# Patient Record
Sex: Female | Born: 1980 | Race: White | Hispanic: No | Marital: Married | Smoking: Never smoker
Health system: Southern US, Community
[De-identification: ages and names within clinical notes are randomized; demographics above are authoritative.]

---

## 1999-06-08 ENCOUNTER — Other Ambulatory Visit: Admission: RE | Admit: 1999-06-08 | Discharge: 1999-06-08 | Payer: Self-pay | Admitting: Gynecology

## 2002-12-17 ENCOUNTER — Other Ambulatory Visit: Admission: RE | Admit: 2002-12-17 | Discharge: 2002-12-17 | Payer: Self-pay | Admitting: Gynecology

## 2003-12-22 ENCOUNTER — Other Ambulatory Visit: Admission: RE | Admit: 2003-12-22 | Discharge: 2003-12-22 | Payer: Self-pay | Admitting: Internal Medicine

## 2004-10-04 ENCOUNTER — Other Ambulatory Visit: Admission: RE | Admit: 2004-10-04 | Discharge: 2004-10-04 | Payer: Self-pay | Admitting: Gynecology

## 2005-02-01 ENCOUNTER — Encounter: Admission: RE | Admit: 2005-02-01 | Discharge: 2005-02-01 | Payer: Self-pay | Admitting: Gynecology

## 2005-04-27 ENCOUNTER — Inpatient Hospital Stay (HOSPITAL_COMMUNITY): Admission: AD | Admit: 2005-04-27 | Discharge: 2005-04-30 | Payer: Self-pay | Admitting: Gynecology

## 2005-06-09 ENCOUNTER — Other Ambulatory Visit: Admission: RE | Admit: 2005-06-09 | Discharge: 2005-06-09 | Payer: Self-pay | Admitting: Gynecology

## 2006-11-24 ENCOUNTER — Other Ambulatory Visit: Admission: RE | Admit: 2006-11-24 | Discharge: 2006-11-24 | Payer: Self-pay | Admitting: Gynecology

## 2008-03-15 ENCOUNTER — Emergency Department (HOSPITAL_BASED_OUTPATIENT_CLINIC_OR_DEPARTMENT_OTHER): Admission: EM | Admit: 2008-03-15 | Discharge: 2008-03-15 | Payer: Self-pay | Admitting: Emergency Medicine

## 2008-03-16 ENCOUNTER — Ambulatory Visit (HOSPITAL_COMMUNITY): Admission: AD | Admit: 2008-03-16 | Discharge: 2008-03-16 | Payer: Self-pay | Admitting: Obstetrics and Gynecology

## 2008-03-16 ENCOUNTER — Encounter (INDEPENDENT_AMBULATORY_CARE_PROVIDER_SITE_OTHER): Payer: Self-pay | Admitting: Obstetrics and Gynecology

## 2008-10-22 ENCOUNTER — Encounter: Admission: RE | Admit: 2008-10-22 | Discharge: 2008-10-22 | Payer: Self-pay | Admitting: Obstetrics and Gynecology

## 2009-02-10 ENCOUNTER — Inpatient Hospital Stay (HOSPITAL_COMMUNITY): Admission: AD | Admit: 2009-02-10 | Discharge: 2009-02-13 | Payer: Self-pay | Admitting: Obstetrics and Gynecology

## 2009-06-21 IMAGING — US US OB COMP LESS 14 WK
1 series · 14 of 23 positions shown · non-contrast
Comparison: None

CLINICAL DATA: Vaginal bleeding, 76w8d by LMP

OBSTETRIC <14 WK ULTRASOUND
TECHNIQUE: Transabdominal ultrasound was performed for evaluation
of the gestation as well as the maternal uterus and adnexal
regions.

[Series 1: us ob comp less 14 wks · 14 of 23 slices shown]
[im 1/23]
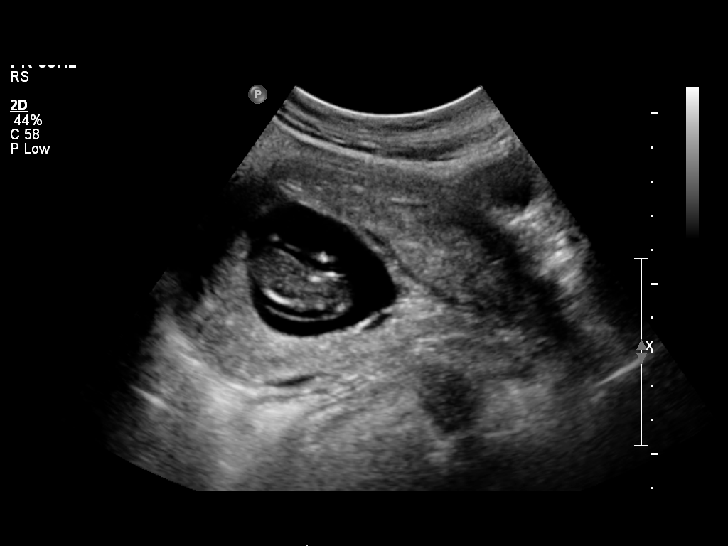
[im 3/23]
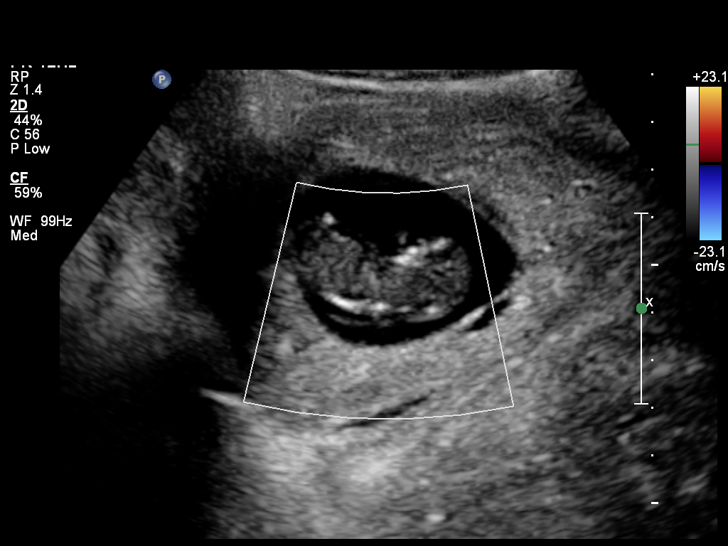
[im 5/23]
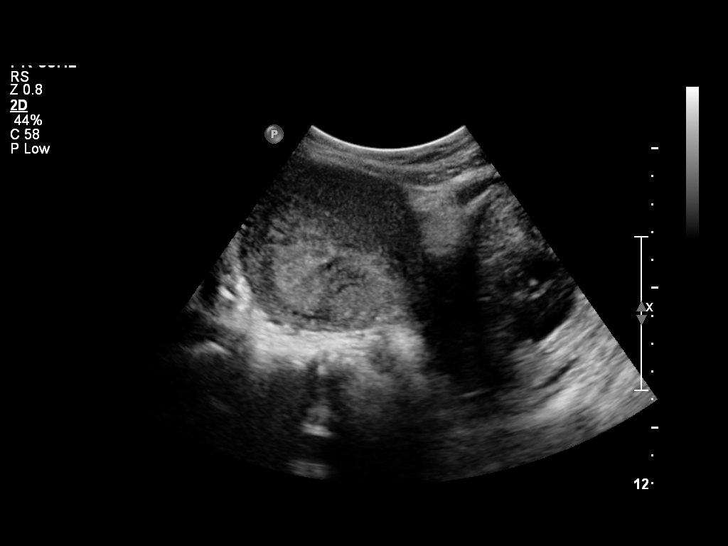
[im 6/23]
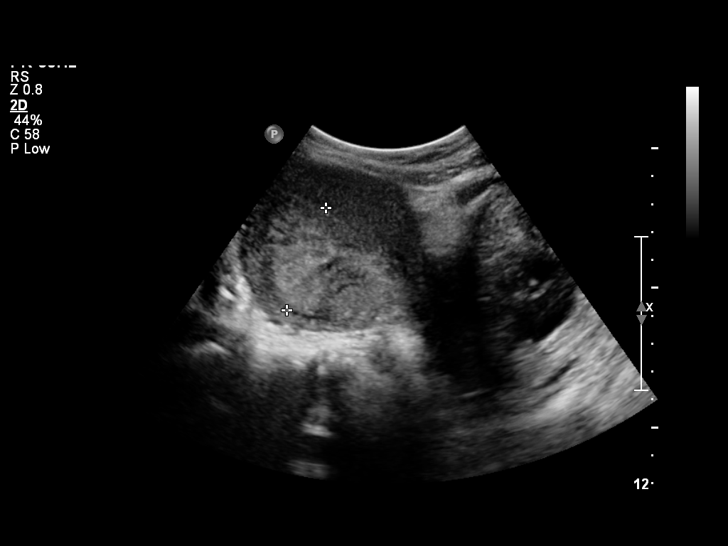
[im 8/23]
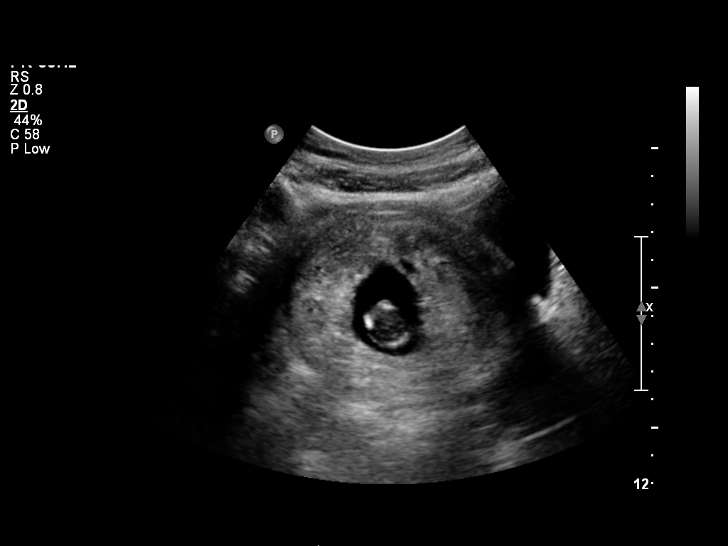
[im 10/23]
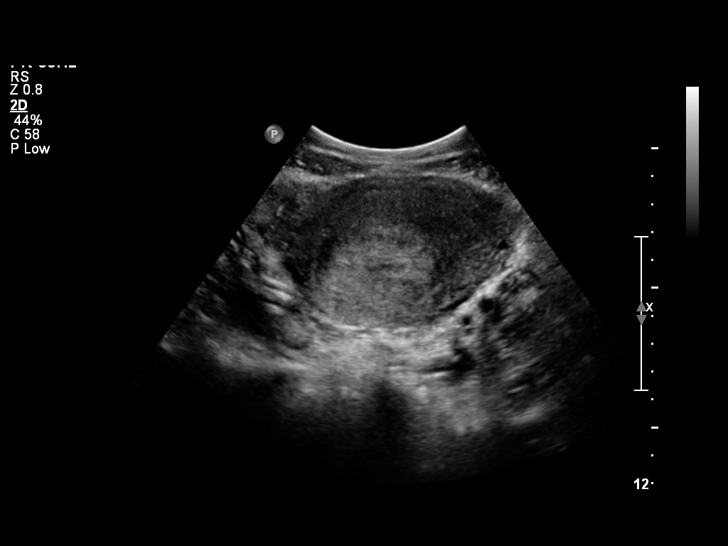
[im 11/23]
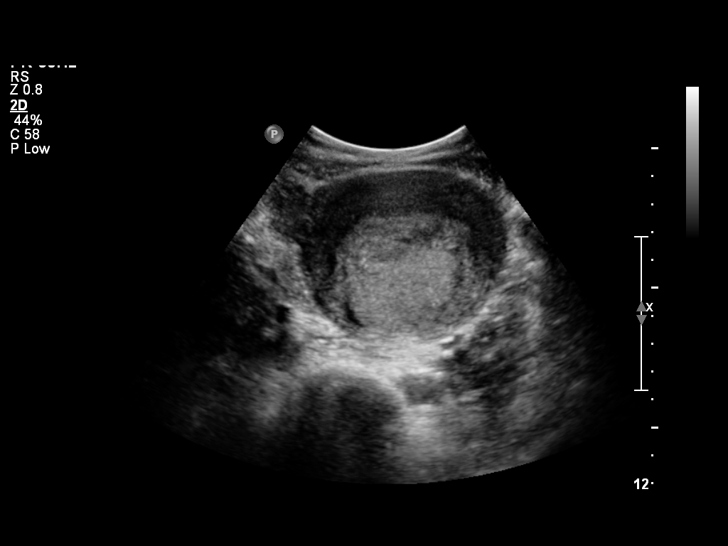
[im 13/23]
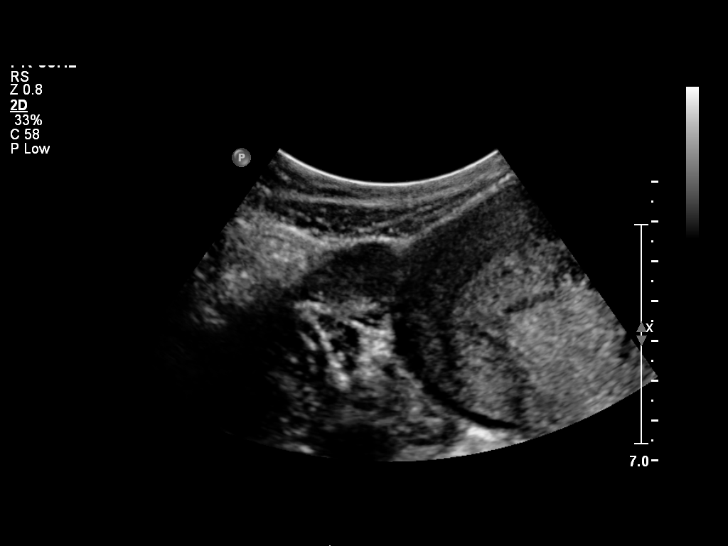
[im 14/23]
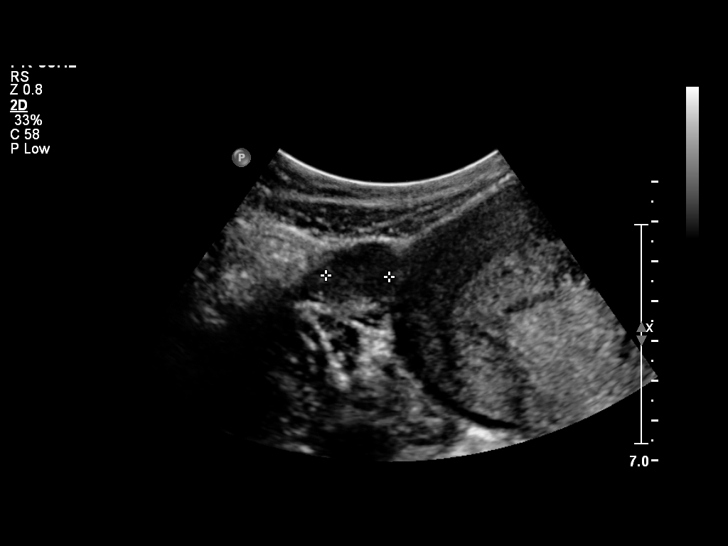
[im 16/23]
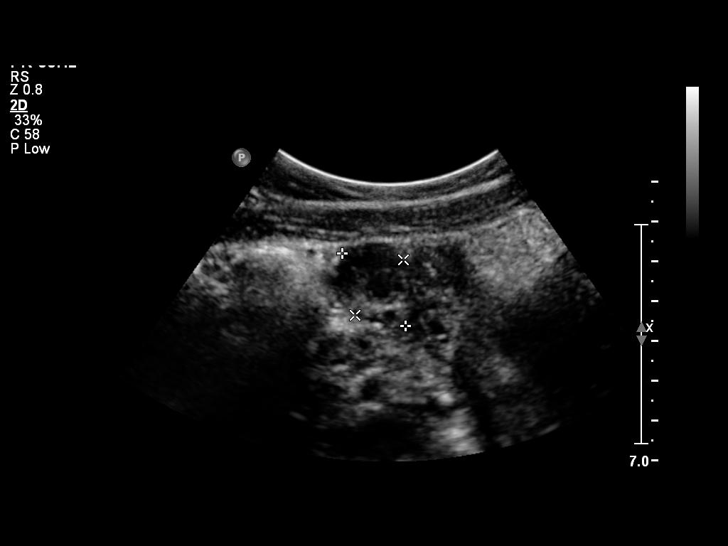
[im 18/23]
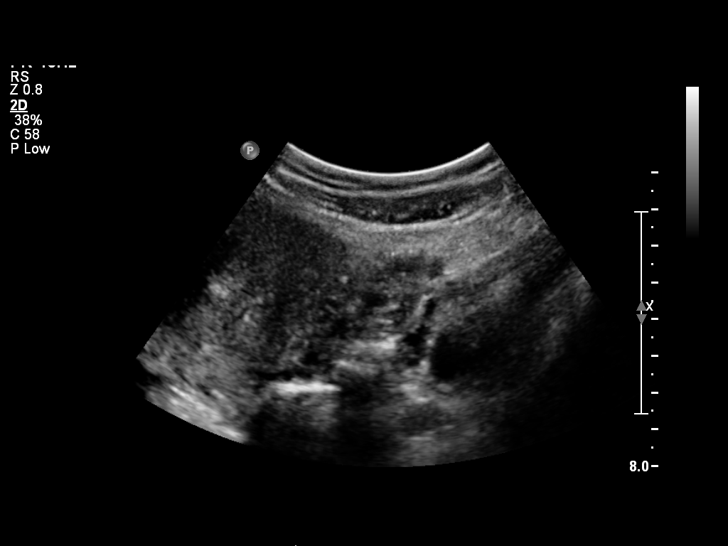
[im 19/23]
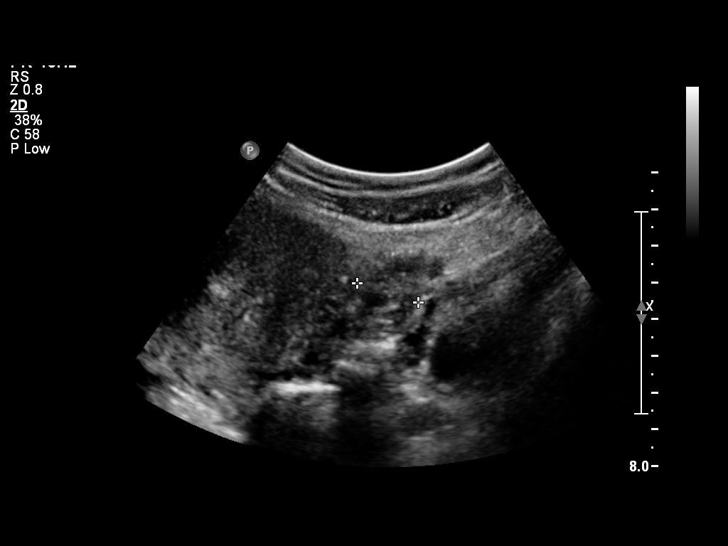
[im 21/23]
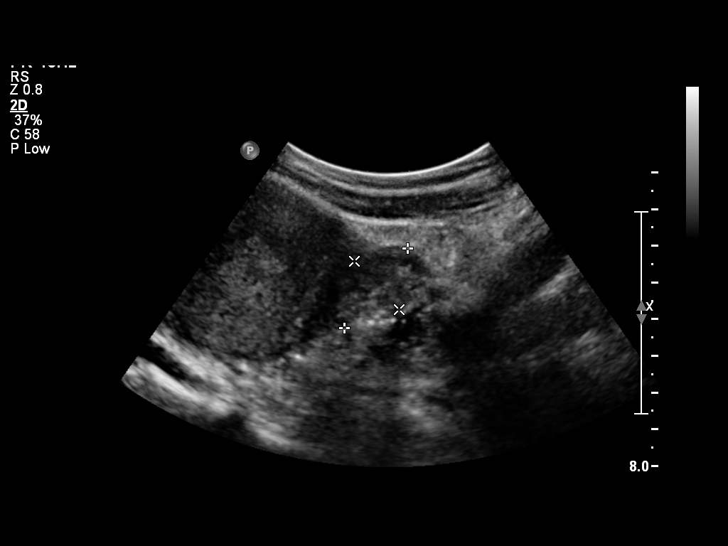
[im 23/23]
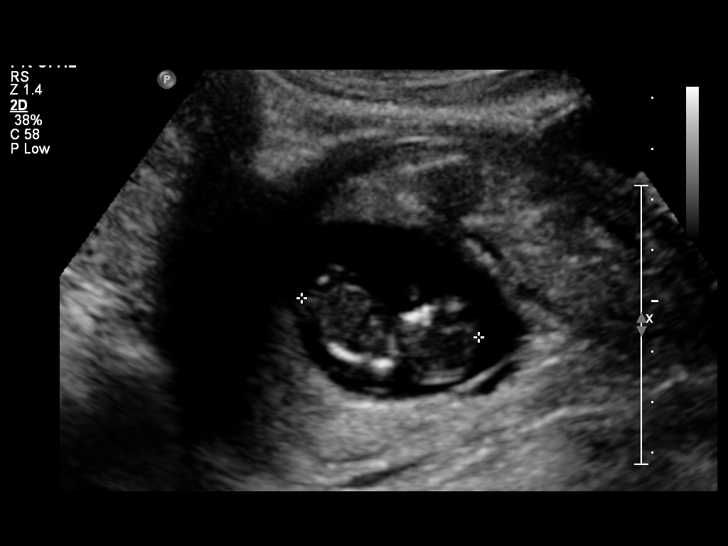

[14 of 23 positions shown; findings below may reference images not displayed]

Intrauterine gestational sac: Present, in lower uterine segment
Yolk sac: Not visualized
Embryo: Visualized
Cardiac Activity: Absent
Heart Rate: Zero bpm

CRL:  3.6 cm         10]w  4d       US EDC: 10/08/2008

Maternal uterus/Adnexae:
Right ovarian corpus luteal cyst incidentally noted.  Left ovary is
normal.  No free fluid.
IMPRESSION: 64w7d intrauterine fetal demise.

## 2011-01-04 LAB — CBC
HCT: 24.9 % — ABNORMAL LOW (ref 36.0–46.0)
HCT: 25.7 % — ABNORMAL LOW (ref 36.0–46.0)
HCT: 26.6 % — ABNORMAL LOW (ref 36.0–46.0)
HCT: 32.4 % — ABNORMAL LOW (ref 36.0–46.0)
Hemoglobin: 11.6 g/dL — ABNORMAL LOW (ref 12.0–15.0)
Hemoglobin: 9.2 g/dL — ABNORMAL LOW (ref 12.0–15.0)
MCHC: 35.1 g/dL (ref 30.0–36.0)
MCHC: 35.2 g/dL (ref 30.0–36.0)
MCHC: 35.6 g/dL (ref 30.0–36.0)
MCHC: 35.9 g/dL (ref 30.0–36.0)
MCV: 99.4 fL (ref 78.0–100.0)
Platelets: 100 10*3/uL — ABNORMAL LOW (ref 150–400)
RBC: 2.51 MIL/uL — ABNORMAL LOW (ref 3.87–5.11)
RDW: 13.6 % (ref 11.5–15.5)
RDW: 13.8 % (ref 11.5–15.5)
RDW: 13.9 % (ref 11.5–15.5)

## 2011-01-04 LAB — GLUCOSE, CAPILLARY: Glucose-Capillary: 80 mg/dL (ref 70–99)

## 2011-02-08 NOTE — Op Note (Signed)
Tricia Holmes, Tricia Holmes            ACCOUNT NO.:  [id number]   MEDICAL RECORD NO.:  [id number]          PATIENT TYPE:  INP   LOCATION:  9148                          FACILITY:  [facility]   PHYSICIAN:  Juluis Mire, M.D.   DATE OF BIRTH:  1981-05-27   DATE OF PROCEDURE:  DATE OF DISCHARGE:                               OPERATIVE REPORT   PREOPERATIVE DIAGNOSIS:  Intrauterine pregnancy at term, desires a  primary cesarean section.   POSTOPERATIVE DIAGNOSIS:  Intrauterine pregnancy at term, desires a  primary cesarean section.   PROCEDURE:  Primary low transverse cesarean section.   SURGEON:  Juluis Mire, MD   ANESTHESIA:  Spinal.   ESTIMATED BLOOD LOSS:  500 mL.   PACKS AND DRAINS:  None.   INTRAOPERATIVE BLOOD PLACED:  None.   COMPLICATIONS:  None.   INDICATIONS:  The patient is a 30 year old, gravida 3, para 1, female  who presents at 23 weeks with spontaneous onset of labor.  Her first  pregnancy was complicated by a fourth-degree laceration with repair.  Because of this, the patient desirous a primary cesarean section.  The  alternatives of a vaginal delivery were discussed.  The risk of cesarean  section were explained including the risk of infection.  The risk of  hemorrhage that could require transfusion with the risk of AIDS or  hepatitis.  Excessive bleeding could require hysterectomy.  There is the  risk of injury to adjacent organs including bladder, bowel, ureters,  could require further exploratory surgery.  Risk of deep venous  thrombosis and pulmonary embolus.  The patient expressed understanding  of potential risk, complications, and still desires primary cesarean  section.   PROCEDURE:  The patient was taken to the OR and placed in supine  position with a left lateral tilt.  After satisfactory level of spinal  anesthesia was obtained, the abdomen was prepped out with Betadine and  draped as a sterile field.  A low transverse skin incision was made with  a  knife, carried through subcutaneous tissue.  The fascia was entered  sharply and the incision in the fascia was extended laterally.  Fascia  taken off the muscle superiorly and inferiorly.  Rectus muscles were  separated in midline.  Peritoneum was entered sharply, and the incision  of perineum extended both superiorly and inferiorly.  A low transverse  bladder flap was developed.  A low transverse uterine scission was begun  with the knife, extended laterally using manual traction.  Amniotic  fluid was clear.  The infant presented in the vertex presentation,  delivered with elevation of the head and fundal pressure.  The infant  was a viable female weighing 7 pounds 8 ounces, Apgars were 9 and 10, pH  is pending.  The placenta was delivered manually.  Uterus exteriorized  for closure.  Tubes and ovaries were unremarkable.  Uterus was closed  with running locking suture of 0 chromic using the 2-layer closure  technique.  Some bleeding from the right side of the uterine incision  brought under control with O'Leary stitch of 0 chromic.  With this, we  had  good hemostasis.  She did have little hematoma in the right broad  ligament.  This seemed to be very stable.  Uterus was returned to the  abdominal cavity, thoroughly irrigated the pelvis, revisit the area on  the right broad ligament and it was intact and not enlarging.  Urine  output was clear and adequate.  Muscles and peritoneum closed with  running suture of 3-0 Vicryl.  Fascia closed with running suture of 0  PDS.  Skin was closed with staples and Steri-Strips.  Sponge,  instrument, needle count was correct by circulating nurse x2.  The  patient tolerated the procedure well, was returned to recovery room in  good condition.      Juluis Mire, M.D.  Electronically Signed     JSM/MEDQ  D:  02/10/2009  T:  02/10/2009  Job:  [id number]

## 2011-02-08 NOTE — Op Note (Signed)
NAMEHAISLEY, ARENS            ACCOUNT NO.:  [id number]   MEDICAL RECORD NO.:  [id number]          PATIENT TYPE:  AMB   LOCATION:  SDC                           [facility]ILITY:  [facility]   PHYSICIAN:  Duke Salvia. Marcelle Overlie, M.D.DATE OF BIRTH:  1981-09-17   DATE OF PROCEDURE:  03/16/2008  DATE OF DISCHARGE:                               OPERATIVE REPORT   PREOPERATIVE DIAGNOSIS:  Incomplete abortion.   POSTOPERATIVE DIAGNOSIS:  Incomplete abortion.   PROCEDURE:  Dilatation and evacuation.   SURGEON:  Duke Salvia. Marcelle Overlie, MD   ANESTHESIA:  Sedation/MAC.   SPECIMENS REMOVED:  Products of conception.   ESTIMATED BLOOD LOSS:  100 mL.   PROCEDURE AND FINDINGS:  The patient was taken to the operating room  after an adequate level of anesthesia was obtained.  The patient was  laid in stirrups.  A sac was noted to be presenting just at the vagina.  This was removed separately.  The vagina was then prepped and draped.  There was a large amount of placenta noted at the cervical os, which was  removed and sent to pathology.  A #8 curved suction curette was then  used to curette a moderate amount of tissue.  When no further tissue  could be removed, a medium curette was then used to explore the cavity  revealing the walls to be clean.  She tolerated this well, did receive  Ancef 1 g IV preop, and had also received Toradol 30 mg IV preop for  pain management.  She went to recovery room in good condition.      Richard M. Marcelle Overlie, M.D.  Electronically Signed     RMH/MEDQ  D:  03/16/2008  T:  03/17/2008  Job:  [id number]

## 2011-02-11 NOTE — Discharge Summary (Signed)
Tricia Holmes, BARMAN            ACCOUNT NO.:  [id number]   MEDICAL RECORD NO.:  [id number]          PATIENT TYPE:  INP   LOCATION:  9148                          [facility]ILITY:  [facility]   PHYSICIAN:  Julio Sicks, N.P.     DATE OF BIRTH:  1980-12-27   DATE OF ADMISSION:  02/10/2009  DATE OF DISCHARGE:  02/13/2009                               DISCHARGE SUMMARY   ADMITTING DIAGNOSES:  1. Intrauterine pregnancy at 54 weeks' estimated gestational age.  2. Spontaneous onset of labor.  3. Gestational diabetes, diet controlled.  4. Previous vaginal delivery with a fourth-degree tear, desires      cesarean delivery.   DISCHARGE DIAGNOSES:  1. Status post low transverse cesarean section.  2. Viable female infant.   PROCEDURE:  Primary low transverse cesarean section.   REASON FOR ADMISSION:  Please see written H and P.   HOSPITAL COURSE:  The patient is 30 year old, gravida 3, para 1, who  presents to [facility] at 31 weeks' estimated  gestational age with spontaneous onset of labor.  Her first pregnancy  had been complicated by a fourth-degree laceration with repair.  Because  of this, the patient was desirous of a primary cesarean delivery.  On  the morning of admission, the patient was taken to the operating room  where spinal anesthesia was administered without difficulty.  A low  transverse incision was made with the delivery of a viable female infant  weighing 7 pounds 8 ounces, Apgars of 9 at 1 minute and 9 at 5 minutes.  The patient tolerated the procedure well and taken to the recovery room  in stable condition.  On postoperative day 1, the patient denied  headache, blurred vision, or right upper quadrant pain.  Vital signs  were stable.  She was afebrile.  Blood pressure was 97/63 to 110/69.  Abdomen was soft with some decrease in bowel sounds.  Fundus was firm,  nontender.  Scant amount of a vaginal bleeding noted.  Incision was  clean, dry, and intact.  Laboratory  findings revealed hemoglobin of 9.2,  platelet count was 76,000.  Ibuprofen was put on hold, and CBC was  ordered for the following morning.  Two-hour postprandial blood sugars  were also ordered.  On postoperative day 2, the patient did complain of  some incisional pain which was relieved with pain medication.  Vital  signs were stable.  Laboratory findings revealed platelets at 86,000.  Percocet was increased to 10/325.  On postoperative day 3, the patient  was without complaint.  She stated that the pain was better controlled  on the higher dose of the Percocet.  Vital signs were stable.  Abdomen  soft.  Fundus firm and nontender.  Incision was clean, dry, and intact.  Staples were removed.  CBC was repeated which revealed platelet count of  100,000, WBC count of 7.2, and hemoglobin of 9.4.  Discharge  instructions were reviewed, and the patient was later discharged home.   CONDITION ON DISCHARGE:  Stable.   DIET:  Regular as tolerated.   ACTIVITY:  No heavy lifting.  No driving x2 weeks.  No vaginal entry.   FOLLOWUP:  The patient to follow up in the office in 1 week for an  incision check and a CBC.  She is to call for temperature greater than  100 degrees, persistent nausea, vomiting, heavy vaginal bleeding, and/or  redness or drainage from the incisional site.   DISCHARGE MEDICATIONS:  1. Percocet 10/325, #30, one p.o. every 4-6 hours p.r.n.  2. Prenatal vitamins 1 p.o. daily.  3. Colace 1 p.o. daily p.r.n.      Julio Sicks, N.P.     CC/MEDQ  D:  03/02/2009  T:  03/03/2009  Job:  [id number]

## 2011-06-23 LAB — URINALYSIS, ROUTINE W REFLEX MICROSCOPIC
Glucose, UA: NEGATIVE
Ketones, ur: NEGATIVE
Nitrite: NEGATIVE
Protein, ur: NEGATIVE
Urobilinogen, UA: 0.2

## 2011-06-23 LAB — WET PREP, GENITAL
Clue Cells Wet Prep HPF POC: NONE SEEN
Trich, Wet Prep: NONE SEEN

## 2011-06-23 LAB — CBC
Hemoglobin: 13.6
Platelets: 130 — ABNORMAL LOW
Platelets: 127 — ABNORMAL LOW
RDW: 12.9
RDW: 12.6
WBC: 6.4

## 2011-06-23 LAB — ABO/RH: ABO/RH(D): O POS

## 2011-06-23 LAB — DIFFERENTIAL
Basophils Absolute: 0
Lymphocytes Relative: 28
Lymphs Abs: 1.8
Neutro Abs: 4
Neutrophils Relative %: 62

## 2011-06-23 LAB — GC/CHLAMYDIA PROBE AMP, GENITAL
Chlamydia, DNA Probe: NEGATIVE
GC Probe Amp, Genital: NEGATIVE

## 2011-12-02 ENCOUNTER — Other Ambulatory Visit: Payer: Self-pay

## 2012-01-12 ENCOUNTER — Other Ambulatory Visit: Payer: Self-pay

## 2013-09-23 ENCOUNTER — Other Ambulatory Visit: Payer: Self-pay | Admitting: Gynecology

## 2013-09-23 DIAGNOSIS — N63 Unspecified lump in unspecified breast: Secondary | ICD-10-CM

## 2013-09-30 ENCOUNTER — Ambulatory Visit
Admission: RE | Admit: 2013-09-30 | Discharge: 2013-09-30 | Disposition: A | Discharge status: Home / Self Care | Payer: BC Managed Care – PPO | Source: Ambulatory Visit | Attending: Gynecology | Admitting: Gynecology

## 2013-09-30 DIAGNOSIS — N63 Unspecified lump in unspecified breast: Secondary | ICD-10-CM

## 2015-01-05 IMAGING — US US BREAST COMPLETE UNI RIGHT INC AXILLA
1 series · 2 of 2 positions shown · non-contrast
Comparison: None.

CLINICAL DATA: Palpable abnormality in the upper right breast.

EXAM:
DIGITAL DIAGNOSTIC  BILATERAL MAMMOGRAM WITH CAD
ULTRASOUND RIGHT BREAST

[Series 1: breast · 2 of 2 slices shown]
[im 1/2]
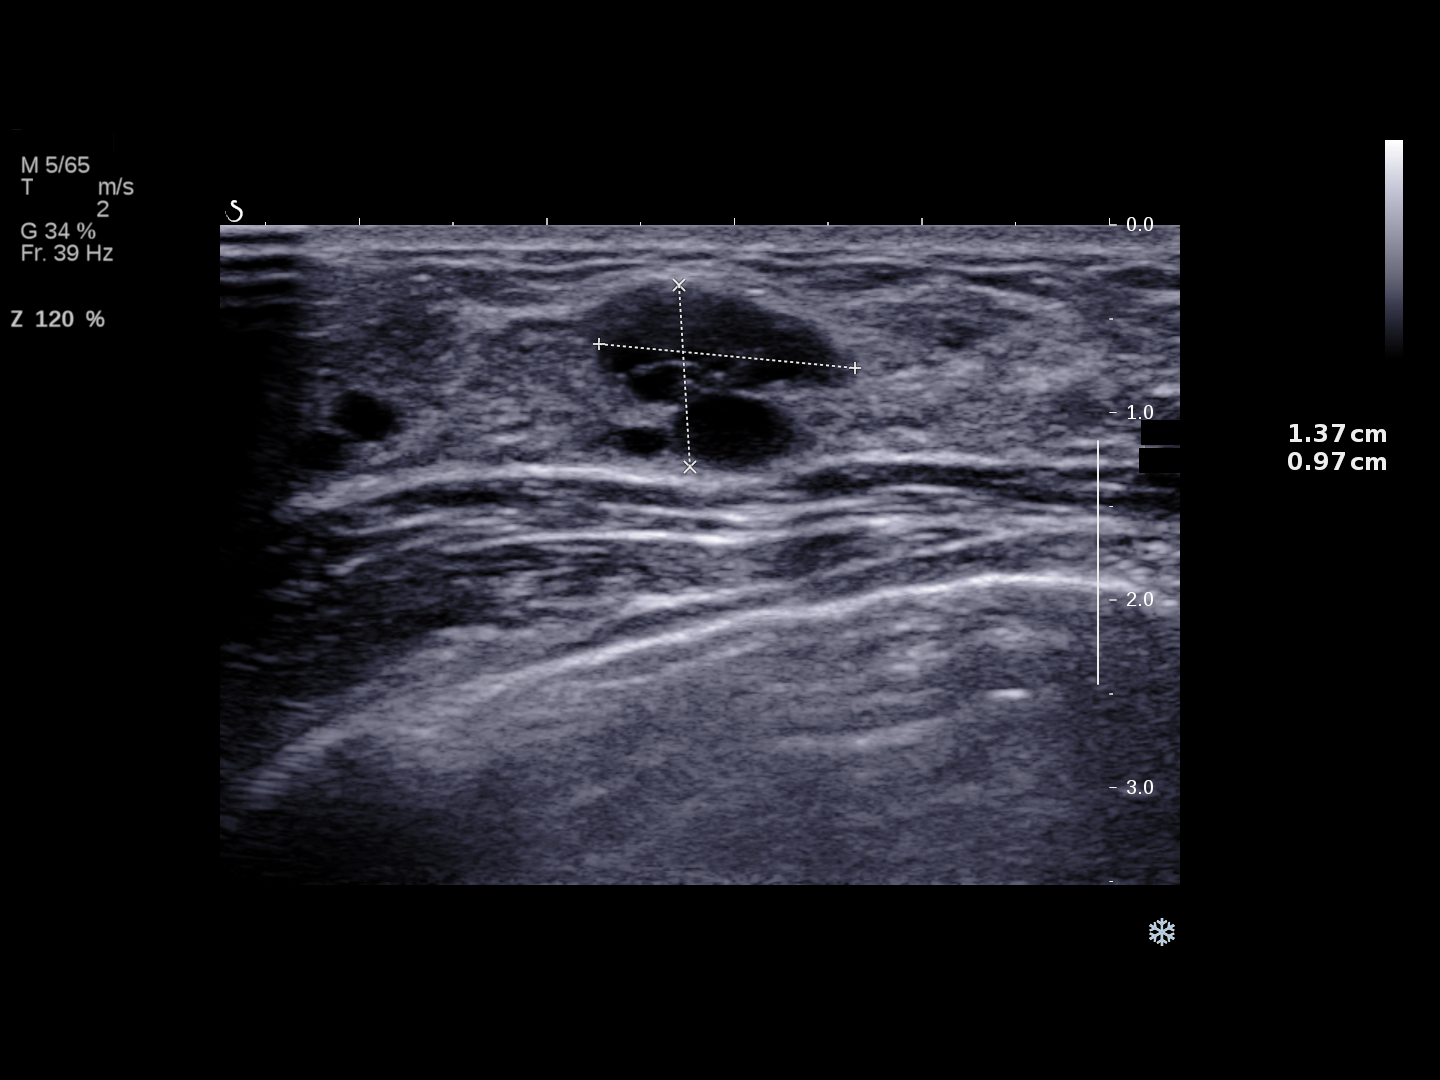
[im 2/2]
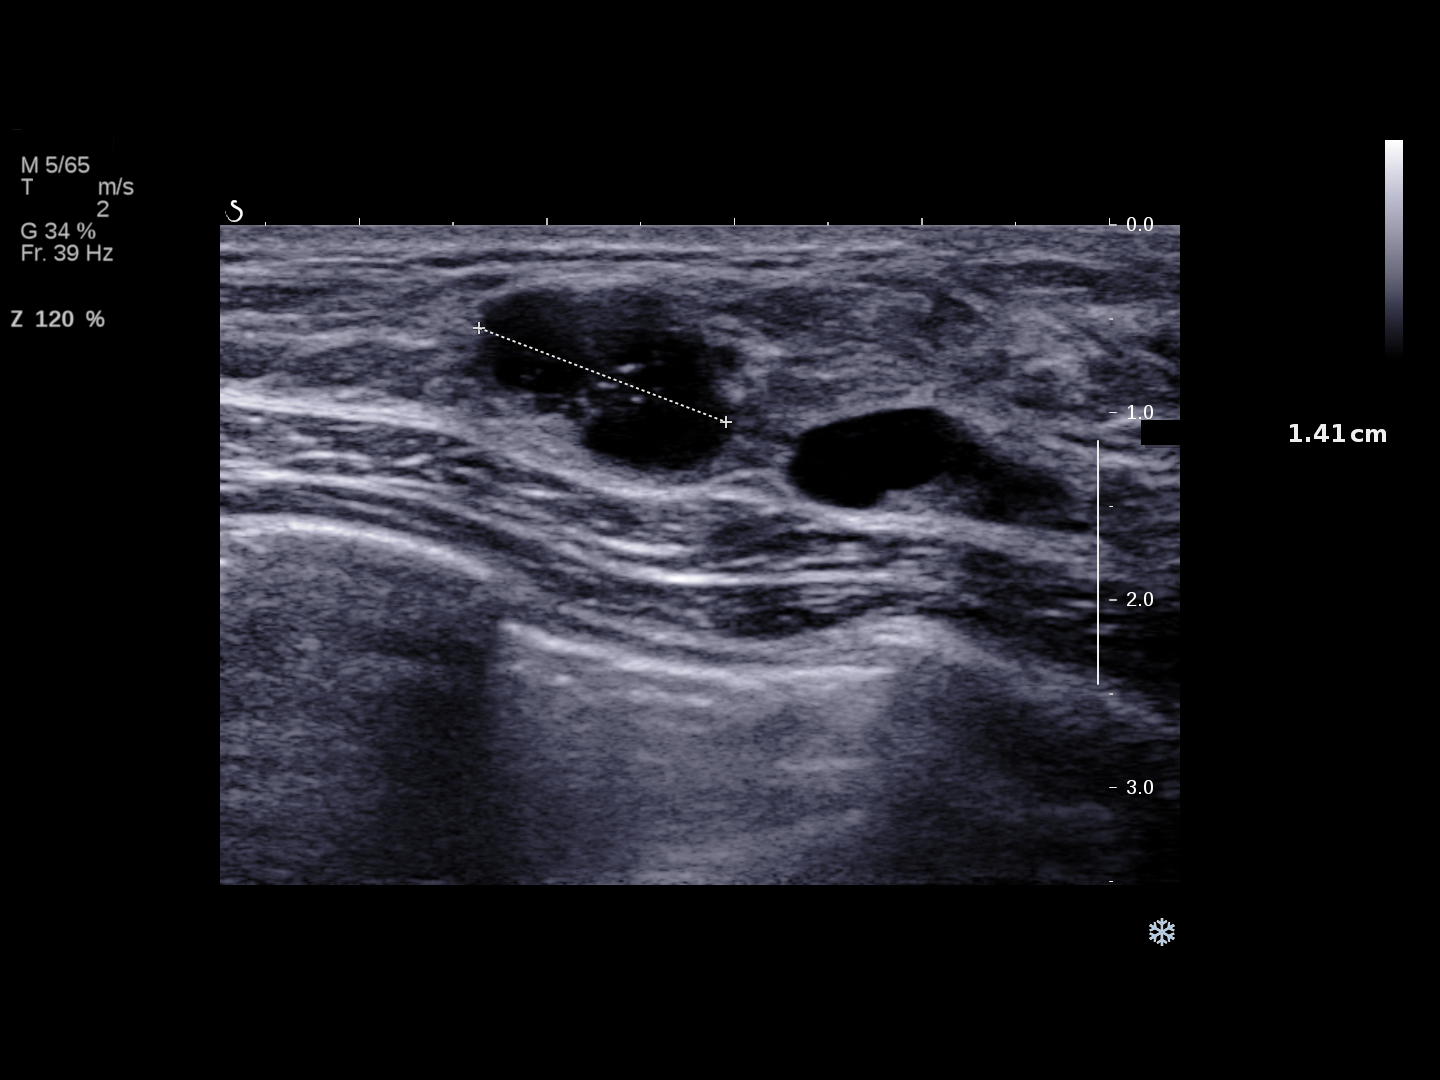

[2 of 2 positions shown; findings below may reference images not displayed]

ACR Breast Density Category d: The breasts are extremely dense,
which lowers the sensitivity of mammography.
FINDINGS: There are no masses, areas of architectural distortion, areas of
significant asymmetry or suspicious calcifications. No mammographic
evidence of malignancy.

Mammographic images were processed with CAD.

On physical exam,there are small nodular areas in the upper right
breast, 1 of which is most prominent, in the 12-1 o'clock position,
best palpated in the sitting position..

Ultrasound is performed, showing multiple cysts. There is a group of
cysts in the [DATE] position, 4 cm from the nipple, measuring 1.4 cm
in greatest dimension, corresponding to the palpable abnormality.
There are no solid or suspicious lesions.
IMPRESSION: Benign right breast cysts.  No evidence of malignancy.

RECOMMENDATION:
Screening mammogram at age 38 unless there are persistent or
intervening clinical concerns. (Code:XD-D-I9O)

I have discussed the findings and recommendations with the patient.
Results were also provided in writing at the conclusion of the
visit.

BI-RADS CATEGORY  2: Benign Finding(s)

## 2015-01-05 IMAGING — MG MM DIAGNOSTIC BILATERAL
5 series · 5 of 5 positions shown · non-contrast
Comparison: None.

CLINICAL DATA: Palpable abnormality in the upper right breast.

EXAM:
DIGITAL DIAGNOSTIC  BILATERAL MAMMOGRAM WITH CAD
ULTRASOUND RIGHT BREAST

[R CC]
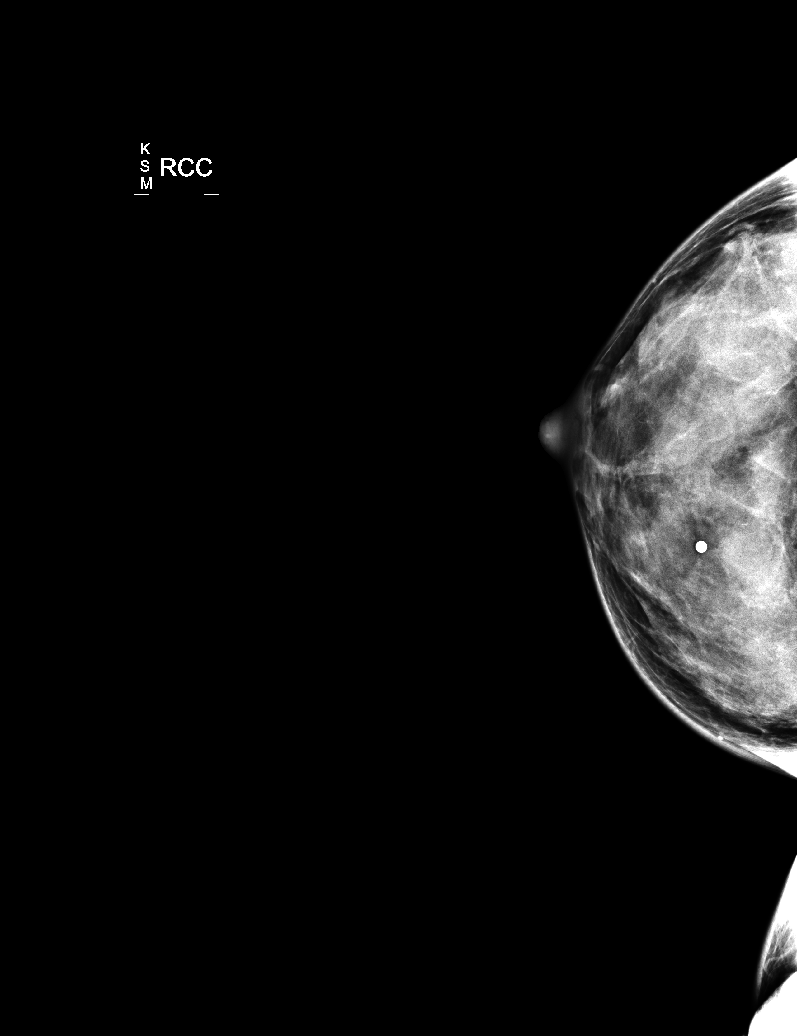

[L CC]
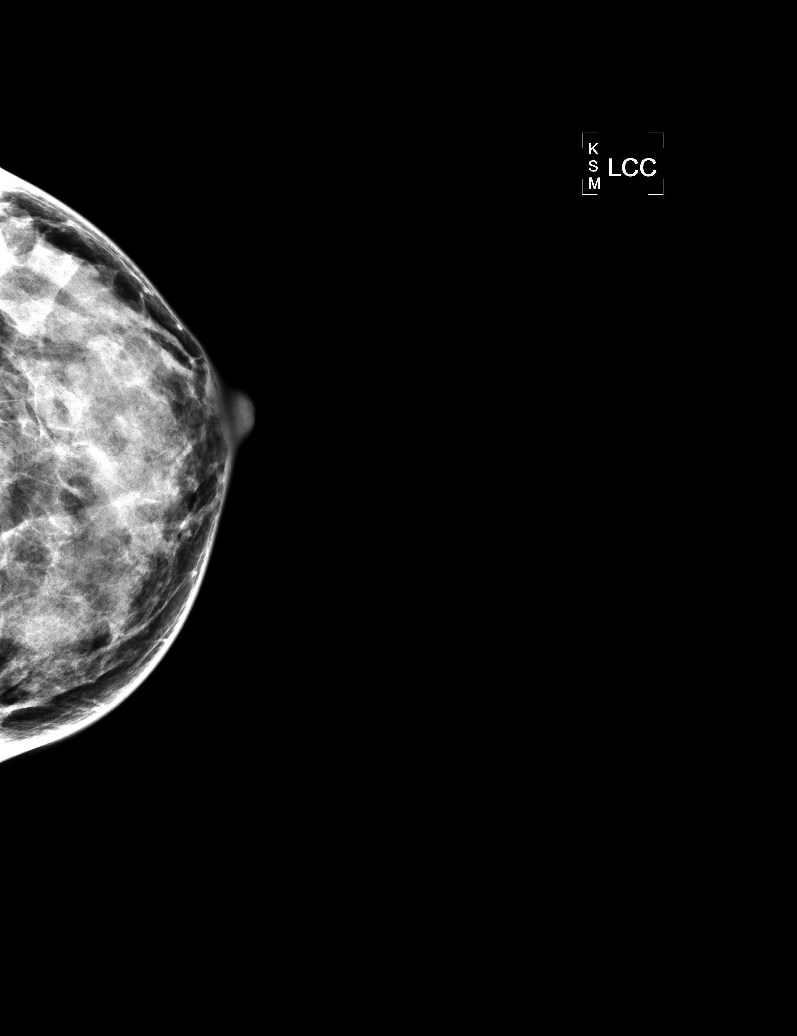

[L MLO]
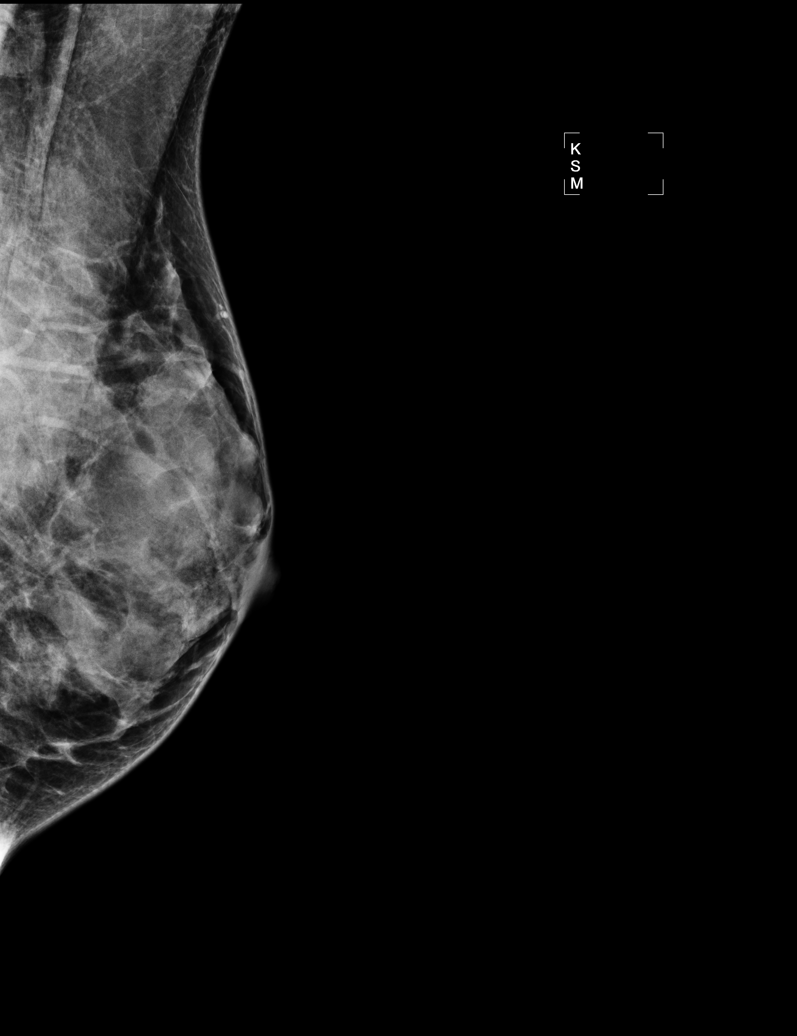

[R MLO]
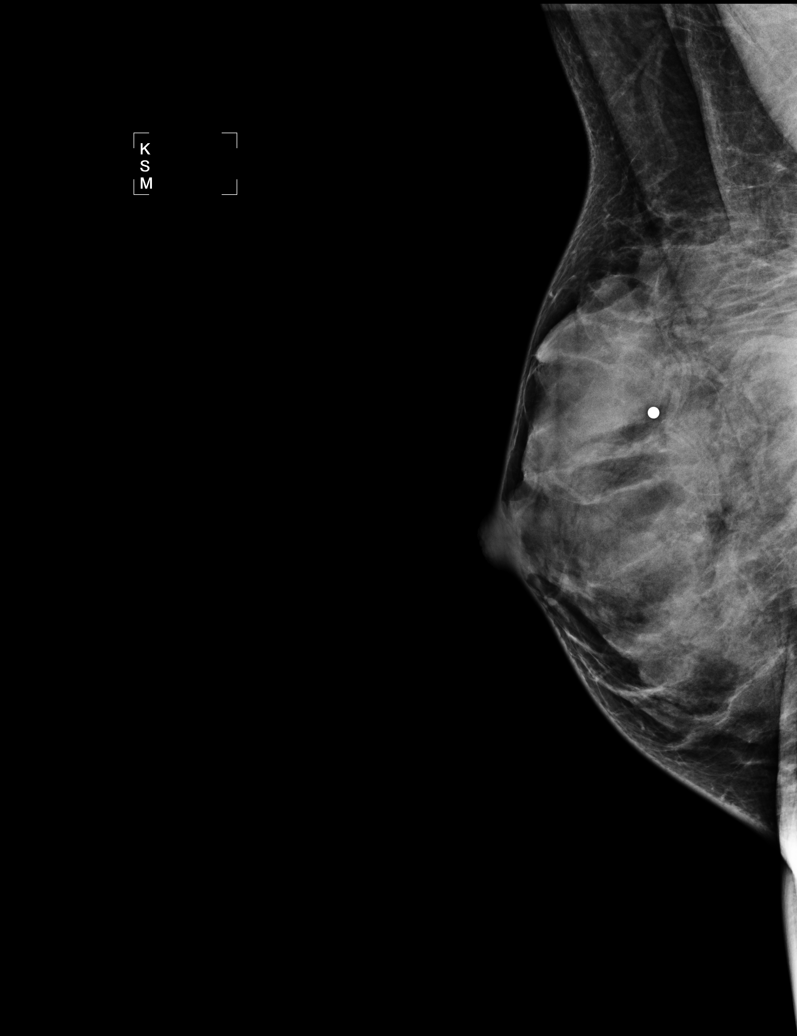

[R TAN]
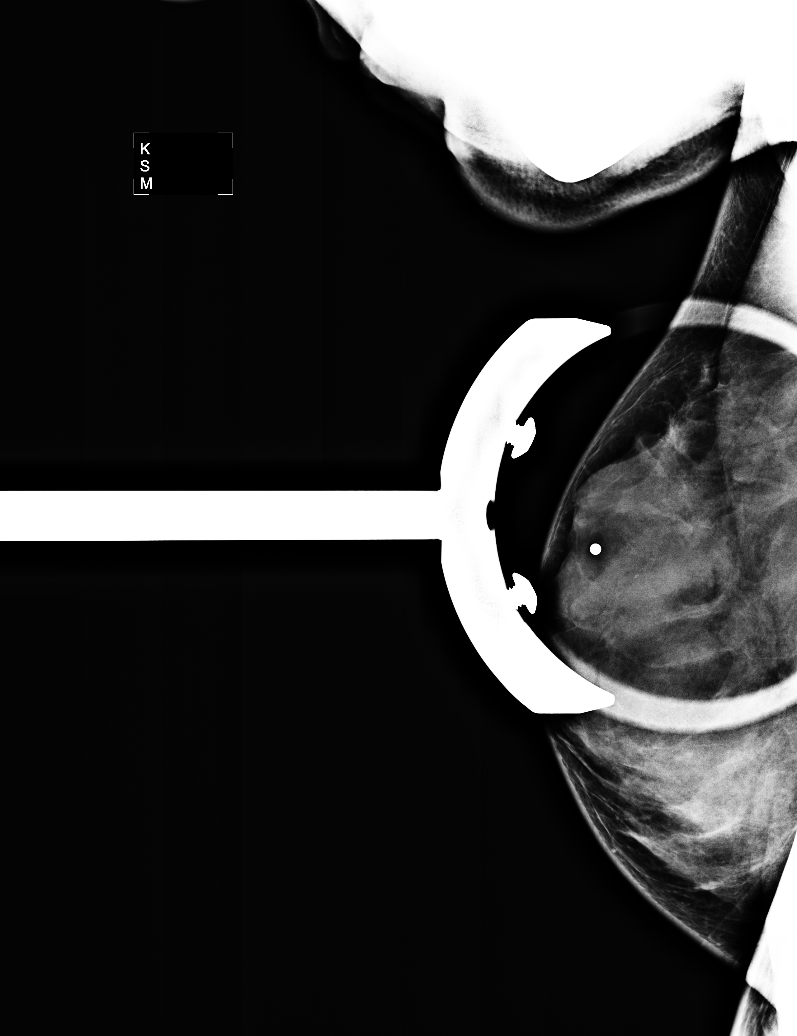

[5 of 5 positions shown; findings below may reference images not displayed]

ACR Breast Density Category d: The breasts are extremely dense,
which lowers the sensitivity of mammography.
FINDINGS: There are no masses, areas of architectural distortion, areas of
significant asymmetry or suspicious calcifications. No mammographic
evidence of malignancy.

Mammographic images were processed with CAD.

On physical exam,there are small nodular areas in the upper right
breast, 1 of which is most prominent, in the 12-1 o'clock position,
best palpated in the sitting position..

Ultrasound is performed, showing multiple cysts. There is a group of
cysts in the [DATE] position, 4 cm from the nipple, measuring 1.4 cm
in greatest dimension, corresponding to the palpable abnormality.
There are no solid or suspicious lesions.
IMPRESSION: Benign right breast cysts.  No evidence of malignancy.

RECOMMENDATION:
Screening mammogram at age 38 unless there are persistent or
intervening clinical concerns. (Code:XD-D-I9O)

I have discussed the findings and recommendations with the patient.
Results were also provided in writing at the conclusion of the
visit.

BI-RADS CATEGORY  2: Benign Finding(s)

## 2018-09-10 ENCOUNTER — Encounter: Payer: Self-pay | Admitting: Family Medicine

## 2018-09-10 ENCOUNTER — Ambulatory Visit: Payer: BLUE CROSS/BLUE SHIELD | Admitting: Family Medicine

## 2018-09-10 VITALS — BP 108/82 | HR 87 | Temp 98.5°F | Ht 63.5 in | Wt 129.0 lb

## 2018-09-10 DIAGNOSIS — Z23 Encounter for immunization: Secondary | ICD-10-CM | POA: Diagnosis not present

## 2018-09-10 DIAGNOSIS — E559 Vitamin D deficiency, unspecified: Secondary | ICD-10-CM

## 2018-09-10 DIAGNOSIS — Z0000 Encounter for general adult medical examination without abnormal findings: Secondary | ICD-10-CM

## 2018-09-10 LAB — CBC
HEMATOCRIT: 36.5 % (ref 36.0–46.0)
Hemoglobin: 12.5 g/dL (ref 12.0–15.0)
MCHC: 34.2 g/dL (ref 30.0–36.0)
MCV: 94 fl (ref 78.0–100.0)
PLATELETS: 162 10*3/uL (ref 150.0–400.0)
RBC: 3.88 Mil/uL (ref 3.87–5.11)
RDW: 13 % (ref 11.5–15.5)
WBC: 5.4 10*3/uL (ref 4.0–10.5)

## 2018-09-10 LAB — LIPID PANEL
CHOL/HDL RATIO: 2
CHOLESTEROL: 168 mg/dL (ref 0–200)
HDL: 67.4 mg/dL (ref >39.00)
LDL CALC: 88 mg/dL (ref 0–99)
NonHDL: 100.64
TRIGLYCERIDES: 65 mg/dL (ref 0.0–149.0)
VLDL: 13 mg/dL (ref 0.0–40.0)

## 2018-09-10 LAB — COMPREHENSIVE METABOLIC PANEL
ALT: 11 U/L (ref 0–35)
AST: 17 U/L (ref 0–37)
Albumin: 4.5 g/dL (ref 3.5–5.2)
Alkaline Phosphatase: 58 U/L (ref 39–117)
BUN: 12 mg/dL (ref 6–23)
CALCIUM: 9.3 mg/dL (ref 8.4–10.5)
CHLORIDE: 101 meq/L (ref 96–112)
CO2: 30 meq/L (ref 19–32)
Creatinine, Ser: 0.85 mg/dL (ref 0.40–1.20)
GFR: 78.78 mL/min (ref >60.00)
Glucose, Bld: 119 mg/dL — ABNORMAL HIGH (ref 70–99)
POTASSIUM: 3.8 meq/L (ref 3.5–5.1)
SODIUM: 137 meq/L (ref 135–145)
Total Bilirubin: 0.3 mg/dL (ref 0.2–1.2)
Total Protein: 6.5 g/dL (ref 6.0–8.3)

## 2018-09-10 LAB — VITAMIN D 25 HYDROXY (VIT D DEFICIENCY, FRACTURES): VITD: 32.57 ng/mL (ref 30.00–100.00)

## 2018-09-10 MED ORDER — SCOPOLAMINE 1 MG/3DAYS TD PT72: 1.0000 | MEDICATED_PATCH | TRANSDERMAL | 0 refills | Status: AC

## 2018-09-10 NOTE — Progress Notes (Signed)
Pre visit review using our clinic review tool, if applicable. No additional management support is needed unless otherwise documented below in the visit note. 

## 2018-09-10 NOTE — Addendum Note (Signed)
Addended by: Sharon Seller B on: 09/10/2018 02:14 PM   Modules accepted: Orders

## 2018-09-10 NOTE — Patient Instructions (Signed)
Give [place] 2-3 business days to get the results of your labs back.   Keep the diet clean and stay active.  Aim to increase weight resistance exercise for both upper and lower body to help attain the 5 lb loss (or shape desired). Diet is important, but this will help more with metabolism.   Let us know if you need anything.

## 2018-09-10 NOTE — Progress Notes (Signed)
Chief Complaint  Patient presents with  . New Patient (Initial Visit)     Well Woman Tricia Holmes is here for a complete physical.   Her last physical was >1 year ago.  Current diet: in general, a "healthy" diet. Current exercise: work out [employer]. No LMP recorded. Seatbelt? Yes  Health Maintenance Pap/HPV- Yes 06/2018; follows with GYN Tetanus- No HIV screening- Yes   History reviewed. No pertinent past medical history.   Past Surgical History:  Procedure Laterality Date  . CESAREAN SECTION      Medications  Current Outpatient Medications on File Prior to Visit  Medication Sig Dispense Refill  . Cholecalciferol (VITAMIN D) 50 MCG (2000 UT) CAPS Take 1 capsule by mouth daily.    . Multiple Vitamin (MULTIVITAMIN) tablet Take 1 tablet by mouth daily.    . valACYclovir (VALTREX) 1000 MG tablet Take 1,000 mg by mouth daily as needed.     Allergies No Known Allergies  Review of Systems: Constitutional:  no unexpected weight changes Eye:  no recent significant change in vision Ear/Nose/Mouth/Throat:  Ears:  no tinnitus or vertigo and no recent change in hearing Nose/Mouth/Throat:  no complaints of nasal congestion, no sore throat Cardiovascular: no chest pain Respiratory:  no cough and no shortness of breath Gastrointestinal:  no abdominal pain, no change in bowel habits GU:  Female: negative for dysuria or pelvic pain Musculoskeletal/Extremities:  no pain of the joints Integumentary (Skin/Breast):  no abnormal skin lesions reported Neurologic:  no headaches Endocrine:  denies fatigue Hematologic/Lymphatic:  No areas of easy bleeding  Exam BP 108/82 (BP Location: Left Arm, Patient Position: Sitting, Cuff Size: Normal)   Pulse 87   Temp 98.5 F (36.9 C) (Oral)   Ht 5' 3.5" (1.613 m)   Wt 129 lb (58.5 kg)   SpO2 98%   BMI 22.49 kg/m  General:  well developed, well nourished, in no apparent distress Skin:  no significant moles, warts, or growths Head:  no  masses, lesions, or tenderness Eyes:  pupils equal and round, sclera anicteric without injection Ears:  canals without lesions, TMs shiny without retraction, no obvious effusion, no erythema Nose:  nares patent, septum midline, mucosa normal, and no drainage or sinus tenderness Throat/Pharynx:  lips and gingiva without lesion; tongue and uvula midline; non-inflamed pharynx; no exudates or postnasal drainage Neck: neck supple without adenopathy, thyromegaly, or masses Lungs:  clear to auscultation, breath sounds equal bilaterally, no respiratory distress Cardio:  regular rate and rhythm, no bruits, no LE edema Abdomen:  abdomen soft, nontender; bowel sounds normal; no masses or organomegaly Genital: Defer to GYN Musculoskeletal:  symmetrical muscle groups noted without atrophy or deformity Extremities:  no clubbing, cyanosis, or edema, no deformities, no skin discoloration Neuro:  gait normal; deep tendon reflexes normal and symmetric Psych: well oriented with normal range of affect and appropriate judgment/insight  Assessment and Plan  Well adult exam - Plan: CBC, Comprehensive metabolic panel, Lipid panel  Vitamin D deficiency  Vitamin D insufficiency - Plan: Vitamin D (25 hydroxy)   Well 37 y.o. female. Counseled on diet and exercise. Doing well. Mammogram scheduled thru GYN. Update flu and tdap today.  Other orders as above. Follow up in 1 yr or prn. The patient voiced understanding and agreement to the plan.  [place], DO 09/10/18 1:39 PM

## 2018-09-11 ENCOUNTER — Other Ambulatory Visit (INDEPENDENT_AMBULATORY_CARE_PROVIDER_SITE_OTHER): Payer: BLUE CROSS/BLUE SHIELD

## 2018-09-11 DIAGNOSIS — R739 Hyperglycemia, unspecified: Secondary | ICD-10-CM | POA: Diagnosis not present

## 2018-09-11 LAB — HEMOGLOBIN A1C: Hgb A1c MFr Bld: 5.2 % (ref 4.6–6.5)

## 2018-10-04 DIAGNOSIS — Z1231 Encounter for screening mammogram for malignant neoplasm of breast: Secondary | ICD-10-CM | POA: Diagnosis not present

## 2018-11-22 DIAGNOSIS — D225 Melanocytic nevi of trunk: Secondary | ICD-10-CM | POA: Diagnosis not present

## 2018-11-22 DIAGNOSIS — D2261 Melanocytic nevi of right upper limb, including shoulder: Secondary | ICD-10-CM | POA: Diagnosis not present

## 2018-11-22 DIAGNOSIS — D2262 Melanocytic nevi of left upper limb, including shoulder: Secondary | ICD-10-CM | POA: Diagnosis not present

## 2019-11-24 ENCOUNTER — Ambulatory Visit: Payer: Self-pay | Attending: Internal Medicine

## 2019-11-24 DIAGNOSIS — Z23 Encounter for immunization: Secondary | ICD-10-CM | POA: Insufficient documentation

## 2019-11-24 NOTE — Progress Notes (Signed)
   Covid-19 Vaccination Clinic  Name:  BLAYZE KOBER    MRN: [id number] DOB: [birth year]  11/24/2019  Ms. Riggles was observed post Covid-19 immunization for 15 minutes without incidence. She was provided with Vaccine Information Sheet and instruction to access the V-Safe system.   Ms. Dadisman was instructed to call 911 with any severe reactions post vaccine: Marland Kitchen Difficulty breathing  . Swelling of your face and throat  . A fast heartbeat  . A bad rash all over your body  . Dizziness and weakness    Immunizations Administered    Name Date Dose VIS Date Route   Pfizer COVID-19 Vaccine 11/24/2019  1:13 PM 0.3 mL 09/06/2019 Intramuscular   Manufacturer: [place]   Lot: [id number]   [place]: [id number]

## 2019-12-17 ENCOUNTER — Ambulatory Visit: Payer: Self-pay | Attending: Internal Medicine

## 2019-12-17 DIAGNOSIS — Z23 Encounter for immunization: Secondary | ICD-10-CM

## 2019-12-17 NOTE — Progress Notes (Signed)
   Covid-19 Vaccination Clinic  Name:  Tricia Holmes    MRN: [id number] DOB: May 11, 1981  12/17/2019  Tricia Holmes was observed post Covid-19 immunization for 15 minutes without incident. She was provided with Vaccine Information Sheet and instruction to access the V-Safe system.   Tricia Holmes was instructed to call 911 with any severe reactions post vaccine: Marland Kitchen Difficulty breathing  . Swelling of face and throat  . A fast heartbeat  . A bad rash all over body  . Dizziness and weakness   Immunizations Administered    Name Date Dose VIS Date Route   Pfizer COVID-19 Vaccine 12/17/2019  2:24 PM 0.3 mL 09/06/2019 Intramuscular   Manufacturer: [place]   Lot: [id number]   [place]: [id number]

## 2020-08-31 ENCOUNTER — Other Ambulatory Visit (HOSPITAL_BASED_OUTPATIENT_CLINIC_OR_DEPARTMENT_OTHER): Payer: Self-pay | Admitting: Internal Medicine

## 2020-08-31 ENCOUNTER — Ambulatory Visit: Payer: Self-pay | Attending: Internal Medicine

## 2020-08-31 DIAGNOSIS — Z23 Encounter for immunization: Secondary | ICD-10-CM

## 2020-09-08 MED FILL — PFIZER-BIONTECH COVID-19 VA: 30 | 1 days supply | Qty: 0 | Fill #0
